# Patient Record
Sex: Male | Born: 1995 | Race: Black or African American | Hispanic: No | Marital: Single | Smoking: Current every day smoker
Health system: Southern US, Community
[De-identification: ages and names within clinical notes are randomized; demographics above are authoritative.]

## PROBLEM LIST (undated history)

## (undated) HISTORY — PX: KNEE SURGERY: SHX244

---
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
## 2015-08-18 ENCOUNTER — Encounter (HOSPITAL_COMMUNITY): Payer: Self-pay | Admitting: Emergency Medicine

## 2015-08-18 ENCOUNTER — Emergency Department (HOSPITAL_COMMUNITY)
Admission: EM | Admit: 2015-08-18 | Discharge: 2015-08-18 | Disposition: A | Discharge status: Home / Self Care | Payer: BLUE CROSS/BLUE SHIELD | Attending: Emergency Medicine | Admitting: Emergency Medicine

## 2015-08-18 DIAGNOSIS — Y998 Other external cause status: Secondary | ICD-10-CM | POA: Insufficient documentation

## 2015-08-18 DIAGNOSIS — S59902A Unspecified injury of left elbow, initial encounter: Secondary | ICD-10-CM | POA: Diagnosis present

## 2015-08-18 DIAGNOSIS — Y92410 Unspecified street and highway as the place of occurrence of the external cause: Secondary | ICD-10-CM | POA: Insufficient documentation

## 2015-08-18 DIAGNOSIS — Y9389 Activity, other specified: Secondary | ICD-10-CM | POA: Insufficient documentation
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
## 2016-09-27 ENCOUNTER — Other Ambulatory Visit: Payer: Self-pay | Admitting: Orthopedic Surgery

## 2016-09-27 DIAGNOSIS — M25561 Pain in right knee: Secondary | ICD-10-CM

## 2016-10-09 ENCOUNTER — Ambulatory Visit
Admission: RE | Admit: 2016-10-09 | Discharge: 2016-10-09 | Disposition: A | Discharge status: Home / Self Care | Payer: Managed Care, Other (non HMO) | Source: Ambulatory Visit | Attending: Orthopedic Surgery | Admitting: Orthopedic Surgery

## 2016-10-09 DIAGNOSIS — M25561 Pain in right knee: Secondary | ICD-10-CM

## 2017-09-20 ENCOUNTER — Encounter (HOSPITAL_COMMUNITY): Payer: Self-pay | Admitting: Emergency Medicine

## 2017-09-20 ENCOUNTER — Emergency Department (HOSPITAL_COMMUNITY): Payer: Managed Care, Other (non HMO)

## 2017-09-20 ENCOUNTER — Emergency Department (HOSPITAL_COMMUNITY)
Admission: EM | Admit: 2017-09-20 | Discharge: 2017-09-20 | Disposition: A | Discharge status: Home / Self Care | Payer: Managed Care, Other (non HMO) | Attending: Emergency Medicine | Admitting: Emergency Medicine

## 2017-09-20 ENCOUNTER — Other Ambulatory Visit: Payer: Self-pay

## 2017-09-20 DIAGNOSIS — M25511 Pain in right shoulder: Secondary | ICD-10-CM | POA: Insufficient documentation

## 2017-09-20 DIAGNOSIS — Z79899 Other long term (current) drug therapy: Secondary | ICD-10-CM | POA: Insufficient documentation

## 2017-09-20 MED ORDER — NAPROXEN 500 MG PO TABS: 500.0000 mg | ORAL_TABLET | Freq: Two times a day (BID) | ORAL | 0 refills | Status: AC

## 2017-09-20 MED ORDER — KETOROLAC TROMETHAMINE 30 MG/ML IJ SOLN
30.0000 mg | Freq: Once | INTRAMUSCULAR | Status: AC
  Administered 2017-09-20: 30 mg via INTRAMUSCULAR
  Filled 2017-09-20: qty 1

## 2017-09-20 MED ORDER — CYCLOBENZAPRINE HCL 10 MG PO TABS: 10.0000 mg | ORAL_TABLET | Freq: Two times a day (BID) | ORAL | 0 refills | Status: AC | PRN
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
## 2017-09-20 NOTE — Discharge Instructions (Addendum)
Your x-ray today was negative. You will likely experience increased soreness for the next few days to weeks.  Please take your medications as prescribed. Return to ED for any severe or worsening pain, chest pain, trouble walking, lightheadedness, blurry vision.
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
## 2019-12-19 ENCOUNTER — Other Ambulatory Visit: Payer: Self-pay

## 2019-12-19 ENCOUNTER — Encounter (HOSPITAL_BASED_OUTPATIENT_CLINIC_OR_DEPARTMENT_OTHER): Payer: Self-pay

## 2019-12-19 ENCOUNTER — Emergency Department (HOSPITAL_BASED_OUTPATIENT_CLINIC_OR_DEPARTMENT_OTHER)
Admission: EM | Admit: 2019-12-19 | Discharge: 2019-12-19 | Disposition: A | Discharge status: Home / Self Care | Payer: BC Managed Care – PPO | Attending: Emergency Medicine | Admitting: Emergency Medicine

## 2019-12-19 DIAGNOSIS — K0510 Chronic gingivitis, plaque induced: Secondary | ICD-10-CM | POA: Diagnosis not present

## 2019-12-19 DIAGNOSIS — K068 Other specified disorders of gingiva and edentulous alveolar ridge: Secondary | ICD-10-CM | POA: Diagnosis present

## 2019-12-19 DIAGNOSIS — F17290 Nicotine dependence, other tobacco product, uncomplicated: Secondary | ICD-10-CM | POA: Diagnosis not present

## 2019-12-19 LAB — BASIC METABOLIC PANEL
Anion gap: 10 (ref 5–15)
BUN: 8 mg/dL (ref 6–20)
CO2: 26 mmol/L (ref 22–32)
Calcium: 9.7 mg/dL (ref 8.9–10.3)
Chloride: 103 mmol/L (ref 98–111)
Creatinine, Ser: 1.05 mg/dL (ref 0.61–1.24)
GFR calc Af Amer: >60 mL/min (ref >60)
GFR calc non Af Amer: >60 mL/min (ref >60)
Glucose, Bld: 95 mg/dL (ref 70–99)
Potassium: 4.4 mmol/L (ref 3.5–5.1)
Sodium: 139 mmol/L (ref 135–145)

## 2019-12-19 LAB — CBC WITH DIFFERENTIAL/PLATELET
Abs Immature Granulocytes: 0.03 10*3/uL (ref 0.00–0.07)
Basophils Absolute: 0 10*3/uL (ref 0.0–0.1)
Basophils Relative: 0 %
Eosinophils Absolute: 0.2 10*3/uL (ref 0.0–0.5)
Eosinophils Relative: 2 %
HCT: 49.8 % (ref 39.0–52.0)
Hemoglobin: 16.2 g/dL (ref 13.0–17.0)
Immature Granulocytes: 0 %
Lymphocytes Relative: 24 %
Lymphs Abs: 2.4 10*3/uL (ref 0.7–4.0)
MCH: 28.6 pg (ref 26.0–34.0)
MCHC: 32.5 g/dL (ref 30.0–36.0)
MCV: 88 fL (ref 80.0–100.0)
Monocytes Absolute: 0.7 10*3/uL (ref 0.1–1.0)
Monocytes Relative: 7 %
Neutro Abs: 6.8 10*3/uL (ref 1.7–7.7)
Neutrophils Relative %: 67 %
Platelets: 197 10*3/uL (ref 150–400)
RBC: 5.66 MIL/uL (ref 4.22–5.81)
RDW: 14.9 % (ref 11.5–15.5)
WBC: 10.2 10*3/uL (ref 4.0–10.5)
nRBC: 0 % (ref 0.0–0.2)

## 2019-12-19 MED ORDER — AMOXICILLIN-POT CLAVULANATE 875-125 MG PO TABS: 1.0000 | ORAL_TABLET | Freq: Two times a day (BID) | ORAL | 0 refills | Status: AC

## 2019-12-19 NOTE — ED Triage Notes (Addendum)
Pt c/o intermittent gum bleeding x 3 days-none at present-states worse when he brushes teeth-has not seen dentist in 2+ years-NAD-steady gait

## 2019-12-19 NOTE — Discharge Instructions (Signed)
Take antibiotics as prescribed.  Take entire course, even if your symptoms improve. Use a mouthwash today after brushing her teeth. It is extremely important that you follow-up with a dentist.  There is information about dentists in the area in the paperwork. Return to the emergency room if you develop fevers, severe worsening dental pain, new bleeding from anywhere else, or with any new, worsening, or concerning symptoms

## 2019-12-19 NOTE — ED Provider Notes (Signed)
[facility] EMERGENCY DEPARTMENT Provider Note   CSN: [id number] Arrival date & time: 12/19/19  1206     History Chief Complaint  Patient presents with  . Oral Pain    Cody Freeman is a 24 y.o. male presenting for evaluation of gingival bleeding.  Patient states he has a history of frequent gingival bleeding with brushing.  However the past several days, he has had persistent bleeding from his right upper gum.  He also reports mild to soreness in the area.  He denies trauma or injury.  He denies bleeding elsewhere.  He brushes his teeth twice a day, intermittently flosses.  He does not do any other oral hygiene care.  He has not followed up with a dentist in several years.  He has a family history of gingival disease, including eventual loss of teeth.  He has no other medical problems, takes medications daily.  No bleeding elsewhere.  HPI     History reviewed. No pertinent past medical history.  There are no problems to display for this patient.   Past Surgical History:  Procedure Laterality Date  . KNEE SURGERY         No family history on file.  Social History   Tobacco Use  . Smoking status: Current Every Day Smoker    Types: Cigars  . Smokeless tobacco: Never Used  Vaping Use  . Vaping Use: Never used  Substance Use Topics  . Alcohol use: Yes    Comment: occ  . Drug use: Yes    Types: Marijuana    Home Medications Prior to Admission medications   Medication Sig Start Date End Date Taking? Authorizing Provider  acetaminophen (TYLENOL) 500 MG tablet Take 500 mg by mouth every 6 (six) hours as needed for pain (pain).    [provider]  amoxicillin-clavulanate (AUGMENTIN) 875-125 MG tablet Take 1 tablet by mouth every 12 (twelve) hours for 7 days. 12/19/19 12/26/19  Marcea Rojek, PA-C  cyclobenzaprine (FLEXERIL) 10 MG tablet Take 1 tablet (10 mg total) by mouth 2 (two) times daily as needed for muscle spasms. 09/20/17   Khatri, Hina,  PA-C  naproxen (NAPROSYN) 500 MG tablet Take 1 tablet (500 mg total) by mouth 2 (two) times daily. 09/20/17   Dietrich Pates, PA-C    Allergies    Patient has no known allergies.  Review of Systems   Review of Systems  Constitutional: Negative for fever.  HENT: Positive for dental problem.        Gingival bleeding    Physical Exam Updated Vital Signs BP (!) 148/102 (BP Location: Right Arm)   Pulse 99   Temp 98.3 F (36.8 C) (Oral)   Resp 18   Ht 6\' 3"  (1.905 m)   Wt (!) 156.5 kg   SpO2 96%   BMI 43.12 kg/m   Physical Exam Vitals and nursing note reviewed.  Constitutional:      General: He is not in acute distress.    Appearance: He is well-developed.     Comments: Sitting in the bed in no acute distress  HENT:     Head: Normocephalic and atraumatic.     Comments: No active bleeding from the gums on my exam, however gums are mildly inflamed.  Mild tenderness palpation of right upper teeth.  No obvious abscess.  No significant dental caries Pulmonary:     Effort: Pulmonary effort is normal.  Abdominal:     General: There is no distension.  Musculoskeletal:  General: Normal range of motion.     Cervical back: Normal range of motion.  Skin:    General: Skin is warm.     Findings: No rash.  Neurological:     Mental Status: He is alert and oriented to person, place, and time.     ED Results / Procedures / Treatments   Labs (all labs ordered are listed, but only abnormal results are displayed) Labs Reviewed  CBC WITH DIFFERENTIAL/PLATELET  BASIC METABOLIC PANEL    EKG None  Radiology No results found.  Procedures Procedures (including critical care time)  Medications Ordered in ED Medications - No data to display  ED Course  I have reviewed the triage vital signs and the nursing notes.  Pertinent labs & imaging results that were available during my care of the patient were reviewed by me and considered in my medical decision making (see chart for  details).    MDM Rules/Calculators/A&P                          Pt presenting for evaluation of bleeding from the gums.  This normally happens with her depression, but is now happening without pression.  On my exam, patient does not have any active bleeding.  No bleeding from elsewhere.  No fevers or chills.  He does have some mild dental soreness.  Likely gingivitis, however considering new onset bleeding from his gums, will check labs (platelets). If normal, likely gingivitis.   Labs interpreted by me, overall reassuring.  Platelets are normal.  Discussed findings with patient.  Discussed treatment with continued good oral hygiene including brushing and mouthwash.  Discussed use of antibiotics to catch any early infection that may be contributing to dental pain and worsening bleeding.  Encourage follow-up with dentistry, resources given.  At this time, patient appears safe for discharge.  Return precautions given.  Patient states he understands and agrees to plan.  Final Clinical Impression(s) / ED Diagnoses Final diagnoses:  Gingivitis    Rx / DC Orders ED Discharge Orders         Ordered    amoxicillin-clavulanate (AUGMENTIN) 875-125 MG tablet  Every 12 hours     Discontinue  Reprint     12/19/19 1438           Iwao Shamblin, PA-C 12/19/19 1450    Tegeler, Canary Brim, MD 12/19/19 1553

## 2023-12-09 ENCOUNTER — Emergency Department (HOSPITAL_COMMUNITY)
Admission: EM | Admit: 2023-12-09 | Discharge: 2023-12-10 | Disposition: A | Discharge status: Home / Self Care | Attending: Emergency Medicine | Admitting: Emergency Medicine

## 2023-12-09 ENCOUNTER — Other Ambulatory Visit: Payer: Self-pay

## 2023-12-09 DIAGNOSIS — T7840XA Allergy, unspecified, initial encounter: Secondary | ICD-10-CM | POA: Diagnosis present

## 2023-12-09 MED ORDER — METHYLPREDNISOLONE SODIUM SUCC 125 MG IJ SOLR
125.0000 mg | Freq: Once | INTRAMUSCULAR | Status: AC
  Administered 2023-12-09: 125 mg via INTRAVENOUS
  Filled 2023-12-09: qty 2

## 2023-12-09 MED ORDER — FAMOTIDINE IN NACL 20-0.9 MG/50ML-% IV SOLN
20.0000 mg | Freq: Once | INTRAVENOUS | Status: AC
  Administered 2023-12-09: 20 mg via INTRAVENOUS
  Filled 2023-12-09: qty 50

## 2023-12-09 MED ORDER — EPINEPHRINE 0.3 MG/0.3ML IJ SOAJ
0.3000 mg | Freq: Once | INTRAMUSCULAR | Status: AC
  Administered 2023-12-09: 0.3 mg via INTRAMUSCULAR
  Filled 2023-12-09: qty 0.3

## 2023-12-09 NOTE — ED Triage Notes (Signed)
 Patient reports sudden onset eyes swelling , mouth itching and mild SOB this evening after eating chicken/lobster and sprayed insect repellant . Airway intact/no oral swelling .

## 2023-12-09 NOTE — ED Provider Notes (Signed)
 [facility]PT [facility] Emergency Department Provider Note MRN:  [id number]  Arrival date & time: 12/10/23     Chief Complaint   Allergic Reaction   History of Present Illness   Cody Freeman is a 28 y.o. year-old male presents to the ED with chief complaint of allergic reaction.  States that he had swelling of his face and eyelids that started around 9pm.  Thinks that it might have been associated with bug repellant.  States that he has had some itching on the top of his mouth.  States that he has a hard time taking a deep breath.  States that when he gets anxious his SOB worsens.  Denies ever having had an allergic reaction like this before. Took 50mg  Benadryl PTA.  History provided by patient.   Review of Systems  Pertinent positive and negative review of systems noted in HPI.    Physical Exam   Vitals:   12/10/23 0100 12/10/23 0215  BP: (!) 141/83 (!) 153/105  Pulse: (!) 56 69  Resp: (!) 26 (!) 22  Temp:  98.6 F (37 C)  SpO2: 98% 100%    CONSTITUTIONAL:  non toxic-appearing, NAD NEURO:  Alert and oriented x 3, CN 3-12 grossly intact EYES:  eyes equal and reactive, significant periorbital swelling L>R ENT/NECK:  Supple, no stridor  CARDIO:  normal rate, regular rhythm, appears well-perfused  PULM:  No respiratory distress, CTAB GI/GU:  non-distended,  MSK/SPINE:  No gross deformities, no edema, moves all extremities  SKIN:  no rash, no hives, atraumatic   *Additional and/or pertinent findings included in MDM below  Diagnostic and Interventional Summary    EKG Interpretation Date/Time:    Ventricular Rate:    PR Interval:    QRS Duration:    QT Interval:    QTC Calculation:   R Axis:      Text Interpretation:         Labs Reviewed - No data to display  No orders to display    Medications  EPINEPHrine  (EPI-PEN) injection 0.3 mg (0.3 mg Intramuscular Given 12/09/23 2227)  famotidine  (PEPCID ) IVPB 20 mg premix (0 mg Intravenous Stopped  12/09/23 2256)  methylPREDNISolone  sodium succinate (SOLU-MEDROL ) 125 mg/2 mL injection 125 mg (125 mg Intravenous Given 12/09/23 2232)     Procedures  /  Critical Care .Critical Care  Performed by: Vicky Charleston, PA-C Authorized by: Vicky Charleston, PA-C   Critical care provider statement:    Critical care time (minutes):  46   Critical care was necessary to treat or prevent imminent or life-threatening deterioration of the following conditions: Allergic reaction needing epipen .   Critical care was time spent personally by me on the following activities:  Development of treatment plan with patient or surrogate, discussions with consultants, evaluation of patient's response to treatment, examination of patient, ordering and review of laboratory studies, ordering and review of radiographic studies, ordering and performing treatments and interventions, pulse oximetry, re-evaluation of patient's condition and review of old charts   ED Course and Medical Decision Making  I have reviewed the triage vital signs, the nursing notes, and pertinent available records from the EMR.  Social Determinants Affecting Complexity of Care: Patient has no clinically significant social determinants affecting this chief complaint..   ED Course:    Medical Decision Making Patient here with allergic reaction.  Uncertain cause, but suspected bug spray.  Has significant swelling of the face, itching to his mouth, and SOB.  No wheezing.  Brought to a  room.  Given epipen . Will give solumedrol and pepcid .    Patient rechecked multiple times.  He states that his swelling is gone down.  He looks improved on my exam.  Will discharge home with EpiPen , prednisone , and Pepcid .  I have encouraged him to continue with Benadryl.  Risk Prescription drug management.         Consultants: No consultations were needed in caring for this patient.   Treatment and Plan: I considered admission due to patient's initial  presentation, but after considering the examination and diagnostic results, patient will not require admission and can be discharged with outpatient follow-up.    Final Clinical Impressions(s) / ED Diagnoses     ICD-10-CM   1. Allergic reaction, initial encounter  T78.40XA       ED Discharge Orders          Ordered    EPINEPHrine  0.3 mg/0.3 mL IJ SOAJ injection  As needed,   Status:  Discontinued        12/10/23 0207    famotidine  (PEPCID ) 20 MG tablet  2 times daily,   Status:  Discontinued        12/10/23 0207    predniSONE  (DELTASONE ) 20 MG tablet  Daily,   Status:  Discontinued        12/10/23 0207    predniSONE  (DELTASONE ) 20 MG tablet  Daily        12/10/23 0223    famotidine  (PEPCID ) 20 MG tablet  2 times daily        12/10/23 0223    EPINEPHrine  0.3 mg/0.3 mL IJ SOAJ injection  As needed        12/10/23 0223              Discharge Instructions Discussed with and Provided to Patient:     Discharge Instructions      Continue taking Benadryl every 6-8 hours for the next 2 days.  Return for new or worsening symptoms.  If you have to use the EpiPen , you should dial 911.       Vicky Charleston, PA-C 12/10/23 9458    Franklyn Sid SAILOR, MD 12/10/23 [phone]

## 2023-12-10 MED ORDER — FAMOTIDINE 20 MG PO TABS: 20.0000 mg | ORAL_TABLET | Freq: Two times a day (BID) | ORAL | 0 refills | Status: DC

## 2023-12-10 MED ORDER — EPINEPHRINE 0.3 MG/0.3ML IJ SOAJ: 0.3000 mg | INTRAMUSCULAR | 0 refills | Status: AC | PRN

## 2023-12-10 MED ORDER — FAMOTIDINE 20 MG PO TABS: 20.0000 mg | ORAL_TABLET | Freq: Two times a day (BID) | ORAL | 0 refills | Status: AC

## 2023-12-10 MED ORDER — PREDNISONE 20 MG PO TABS: 40.0000 mg | ORAL_TABLET | Freq: Every day | ORAL | 0 refills | Status: DC

## 2023-12-10 MED ORDER — PREDNISONE 20 MG PO TABS: 40.0000 mg | ORAL_TABLET | Freq: Every day | ORAL | 0 refills | Status: AC

## 2023-12-10 MED ORDER — EPINEPHRINE 0.3 MG/0.3ML IJ SOAJ: 0.3000 mg | INTRAMUSCULAR | 0 refills | Status: DC | PRN

## 2023-12-10 NOTE — Discharge Instructions (Addendum)
 Continue taking Benadryl every 6-8 hours for the next 2 days.  Return for new or worsening symptoms.  If you have to use the EpiPen , you should dial 911.

## 2023-12-10 NOTE — ED Notes (Signed)
 Questions and concerns addressed. Discharge teaching completed.   Prescriptions reviewed and pharmacy verified.   Pt ambulatory upon discharge.

## 2024-05-19 ENCOUNTER — Emergency Department
Admission: EM | Admit: 2024-05-19 | Discharge: 2024-05-19 | Disposition: A | Discharge status: Home / Self Care | Attending: Emergency Medicine | Admitting: Emergency Medicine

## 2024-05-19 ENCOUNTER — Other Ambulatory Visit: Payer: Self-pay

## 2024-05-19 ENCOUNTER — Emergency Department

## 2024-05-19 DIAGNOSIS — K589 Irritable bowel syndrome without diarrhea: Secondary | ICD-10-CM | POA: Insufficient documentation

## 2024-05-19 DIAGNOSIS — I10 Essential (primary) hypertension: Secondary | ICD-10-CM | POA: Insufficient documentation

## 2024-05-19 LAB — BASIC METABOLIC PANEL WITH GFR
Anion gap: 13 (ref 5–15)
BUN: 9 mg/dL (ref 6–20)
CO2: 26 mmol/L (ref 22–32)
Calcium: 9.7 mg/dL (ref 8.9–10.3)
Chloride: 98 mmol/L (ref 98–111)
Creatinine, Ser: 0.97 mg/dL (ref 0.61–1.24)
GFR, Estimated: >60 mL/min (ref >60)
Glucose, Bld: 93 mg/dL (ref 70–99)
Potassium: 4.2 mmol/L (ref 3.5–5.1)
Sodium: 137 mmol/L (ref 135–145)

## 2024-05-19 LAB — CBC
HCT: 45.4 % (ref 39.0–52.0)
Hemoglobin: 15 g/dL (ref 13.0–17.0)
MCH: 29.2 pg (ref 26.0–34.0)
MCHC: 33 g/dL (ref 30.0–36.0)
MCV: 88.3 fL (ref 80.0–100.0)
Platelets: 208 K/uL (ref 150–400)
RBC: 5.14 MIL/uL (ref 4.22–5.81)
RDW: 14.2 % (ref 11.5–15.5)
WBC: 13.1 K/uL — ABNORMAL HIGH (ref 4.0–10.5)
nRBC: 0 % (ref 0.0–0.2)

## 2024-05-19 MED ORDER — AMLODIPINE BESYLATE 5 MG PO TABS: 5.0000 mg | ORAL_TABLET | Freq: Every day | ORAL | 11 refills | Status: AC

## 2024-05-19 MED ORDER — ONDANSETRON 4 MG PO TBDP: 4.0000 mg | ORAL_TABLET | Freq: Three times a day (TID) | ORAL | 0 refills | Status: AC | PRN

## 2024-05-19 MED ORDER — ONDANSETRON 4 MG PO TBDP
4.0000 mg | ORAL_TABLET | Freq: Once | ORAL | Status: AC
  Administered 2024-05-19: 4 mg via ORAL
  Filled 2024-05-19: qty 1

## 2024-05-19 MED ORDER — DICYCLOMINE HCL 10 MG PO CAPS
10.0000 mg | ORAL_CAPSULE | Freq: Once | ORAL | Status: AC
  Administered 2024-05-19: 10 mg via ORAL
  Filled 2024-05-19: qty 1

## 2024-05-19 MED ORDER — DICYCLOMINE HCL 10 MG PO CAPS: 10.0000 mg | ORAL_CAPSULE | Freq: Three times a day (TID) | ORAL | 1 refills | Status: AC

## 2024-05-19 NOTE — ED Triage Notes (Signed)
 Pt presents with shortness of breath, reports nausea, vomiting and diarrhea. Pt reports 6 times vomited and 5 episodes of diarrhea. Pt talks in complete sentences no respiratory distress noted

## 2024-05-19 NOTE — Discharge Instructions (Addendum)
 Your exam, and labs, EKG, and chest x-ray are normal and reassuring at this time.  Follow-up with GI medicine as discussed.  Take the prescription meds as directed.  Return to the ED if needed.

## 2024-05-19 NOTE — ED Provider Notes (Signed)
 [facility] Emergency Department Provider Note     Event Date/Time   First MD Initiated Contact with Patient 05/19/24 1804     (approximate)   History   Shortness of Breath and Emesis   HPI  Cody Freeman is a 28 y.o. male with no listed medical history, presents to the ED endorsing some nausea, vomiting, and diarrhea.  Patient with endorsed 6 episodes of nonbloody, nonbilious emesis as well as 5 episodes of watery diarrhea.  He is also endorsing some associated shortness of breath but denies any frank chest pain, fevers, chills, or sweats.  Patient presents to the ED for evaluation of his symptoms.  Physical Exam   Triage Vital Signs: ED Triage Vitals  Encounter Vitals Group     BP 05/19/24 1647 (!) 165/119     Girls Systolic BP Percentile --      Girls Diastolic BP Percentile --      Boys Systolic BP Percentile --      Boys Diastolic BP Percentile --      Pulse Rate 05/19/24 1647 99     Resp 05/19/24 1647 16     Temp 05/19/24 1645 98.4 F (36.9 C)     Temp Source 05/19/24 1645 Oral     SpO2 05/19/24 1647 98 %     Weight 05/19/24 1649 (!) 360 lb (163.3 kg)     Height 05/19/24 1649 6' 2 (1.88 m)     Head Circumference --      Peak Flow --      Pain Score 05/19/24 1649 7     Pain Loc --      Pain Education --      Exclude from Growth Chart --     Most recent vital signs: Vitals:   05/19/24 1645 05/19/24 1647  BP:  (!) 165/119  Pulse:  99  Resp:  16  Temp: 98.4 F (36.9 C) 98.4 F (36.9 C)  SpO2:  98%    General Awake, no distress. *** {**HEENT NCAT. PERRL. EOMI. No rhinorrhea. Mucous membranes are moist. **} CV:  Good peripheral perfusion. *** RESP:  Normal effort. *** ABD:  No distention. *** MSK:  ***   ED Results / Procedures / Treatments   Labs (all labs ordered are listed, but only abnormal results are displayed) Labs Reviewed  CBC - Abnormal; Notable for the following components:      Result Value   WBC 13.1  (*)    All other components within normal limits  BASIC METABOLIC PANEL WITH GFR    EKG Vent. rate 89 BPM  PR interval 174 ms  QRS duration 98 ms  QT/QTcB 342/416 ms  -R-T axes 47 31 -54  Normal sinus rhythm  Left ventricular hypertrophy with repolarization abnormality ( R in aVL , Romhilt-Estes ) Abnormal ECG No previous ECGs available   RADIOLOGY  I personally viewed and evaluated these images as part of my medical decision making, as well as reviewing the written report by the radiologist.  ED Provider Interpretation: No acute intrathoracic findings  DG Chest 2 View Result Date: 05/19/2024 EXAM: 2 VIEW(S) XRAY OF THE CHEST 05/19/2024 05:14:00 PM COMPARISON: None available. CLINICAL HISTORY: Shortness of breath FINDINGS: LUNGS AND PLEURA: Low lung volumes. No focal pulmonary opacity. No pleural effusion. No pneumothorax. HEART AND MEDIASTINUM: No acute abnormality of the cardiac and mediastinal silhouettes. BONES AND SOFT TISSUES: No acute osseous abnormality. IMPRESSION: 1. Low lung volumes. Electronically signed by: Greig  Maple MD 05/19/2024 05:28 PM EST RP Workstation: [id number]     PROCEDURES:  Critical Care performed: No  Procedures   MEDICATIONS ORDERED IN ED: Medications - No data to display   IMPRESSION / MDM / ASSESSMENT AND PLAN / ED COURSE  I reviewed the triage vital signs and the nursing notes.                              Differential diagnosis includes, but is not limited to, acute appendicitis, renal colic, testicular torsion, urinary tract infection/pyelonephritis, prostatitis,  epididymitis, diverticulitis, small bowel obstruction or ileus, colitis, abdominal aortic aneurysm, gastroenteritis, hernia, etc.   Patient's presentation is most consistent with acute complicated illness / injury requiring diagnostic workup.   Patient's diagnosis is consistent with ***. Patient will be discharged home with prescriptions for ***. Patient is to follow up  with *** as needed or otherwise directed. Patient is given ED precautions to return to the ED for any worsening or new symptoms.     FINAL CLINICAL IMPRESSION(S) / ED DIAGNOSES   Final diagnoses:  None     Rx / DC Orders   ED Discharge Orders     None        Note:  This document was prepared using Dragon voice recognition software and may include unintentional dictation errors.
